# Patient Record
Sex: Male | Born: 1985 | Race: White | Hispanic: No | Marital: Married | Smoking: Current every day smoker
Health system: Southern US, Community
[De-identification: ages and names within clinical notes are randomized; demographics above are authoritative.]

## PROBLEM LIST (undated history)

## (undated) DIAGNOSIS — F329 Major depressive disorder, single episode, unspecified: Secondary | ICD-10-CM

## (undated) DIAGNOSIS — F32A Depression, unspecified: Secondary | ICD-10-CM

## (undated) HISTORY — PX: WISDOM TOOTH EXTRACTION: SHX21

## (undated) HISTORY — PX: TONSILLECTOMY: SUR1361

---

## 2005-06-12 ENCOUNTER — Emergency Department (HOSPITAL_COMMUNITY): Admission: EM | Admit: 2005-06-12 | Discharge: 2005-06-12 | Payer: Self-pay | Admitting: Emergency Medicine

## 2006-01-16 ENCOUNTER — Ambulatory Visit (HOSPITAL_COMMUNITY): Admission: RE | Admit: 2006-01-16 | Discharge: 2006-01-16 | Payer: Self-pay | Admitting: Family Medicine

## 2008-02-17 ENCOUNTER — Emergency Department (HOSPITAL_COMMUNITY): Admission: EM | Admit: 2008-02-17 | Discharge: 2008-02-17 | Payer: Self-pay | Admitting: Emergency Medicine

## 2008-02-24 ENCOUNTER — Emergency Department (HOSPITAL_COMMUNITY): Admission: EM | Admit: 2008-02-24 | Discharge: 2008-02-24 | Payer: Self-pay | Admitting: Emergency Medicine

## 2008-08-29 ENCOUNTER — Emergency Department (HOSPITAL_COMMUNITY): Admission: EM | Admit: 2008-08-29 | Discharge: 2008-08-29 | Payer: Self-pay | Admitting: Emergency Medicine

## 2008-09-20 ENCOUNTER — Emergency Department (HOSPITAL_COMMUNITY): Admission: EM | Admit: 2008-09-20 | Discharge: 2008-09-20 | Payer: Self-pay | Admitting: Emergency Medicine

## 2008-12-15 ENCOUNTER — Ambulatory Visit (HOSPITAL_COMMUNITY): Admission: RE | Admit: 2008-12-15 | Discharge: 2008-12-15 | Payer: Self-pay | Admitting: Chiropractic Medicine

## 2008-12-19 ENCOUNTER — Emergency Department (HOSPITAL_COMMUNITY): Admission: EM | Admit: 2008-12-19 | Discharge: 2008-12-19 | Payer: Self-pay | Admitting: Emergency Medicine

## 2009-02-17 ENCOUNTER — Emergency Department (HOSPITAL_COMMUNITY): Admission: EM | Admit: 2009-02-17 | Discharge: 2009-02-17 | Payer: Self-pay | Admitting: Emergency Medicine

## 2009-07-31 ENCOUNTER — Emergency Department (HOSPITAL_COMMUNITY): Admission: EM | Admit: 2009-07-31 | Discharge: 2009-07-31 | Payer: Self-pay | Admitting: Emergency Medicine

## 2010-05-13 LAB — URINALYSIS, ROUTINE W REFLEX MICROSCOPIC
Bilirubin Urine: NEGATIVE
Nitrite: NEGATIVE
pH: 6 (ref 5.0–8.0)

## 2010-05-13 LAB — BASIC METABOLIC PANEL
BUN: 16 mg/dL (ref 6–23)
CO2: 24 mEq/L (ref 19–32)
Chloride: 101 mEq/L (ref 96–112)
GFR calc non Af Amer: >60 mL/min (ref >60)
Glucose, Bld: 90 mg/dL (ref 70–99)
Sodium: 133 mEq/L — ABNORMAL LOW (ref 135–145)

## 2010-05-13 LAB — DIFFERENTIAL
Basophils Absolute: 0 10*3/uL (ref 0.0–0.1)
Basophils Relative: 0 % (ref 0–1)
Eosinophils Absolute: 0 10*3/uL (ref 0.0–0.7)
Eosinophils Relative: 0 % (ref 0–5)
Lymphocytes Relative: 8 % — ABNORMAL LOW (ref 12–46)
Lymphs Abs: 0.7 10*3/uL (ref 0.7–4.0)
Neutro Abs: 6.9 10*3/uL (ref 1.7–7.7)

## 2010-05-13 LAB — CBC
Hemoglobin: 15.6 g/dL (ref 13.0–17.0)
Platelets: 179 10*3/uL (ref 150–400)
RBC: 5.22 MIL/uL (ref 4.22–5.81)

## 2010-05-27 LAB — BASIC METABOLIC PANEL
BUN: 19 mg/dL (ref 6–23)
CO2: 22 mEq/L (ref 19–32)
Calcium: 9.4 mg/dL (ref 8.4–10.5)
Chloride: 104 mEq/L (ref 96–112)
Creatinine, Ser: 1.11 mg/dL (ref 0.4–1.5)
GFR calc Af Amer: >60 mL/min (ref >60)
GFR calc non Af Amer: >60 mL/min (ref >60)
Glucose, Bld: 119 mg/dL — ABNORMAL HIGH (ref 70–99)

## 2010-05-27 LAB — CBC
Hemoglobin: 16.5 g/dL (ref 13.0–17.0)
MCV: 87.6 fL (ref 78.0–100.0)
WBC: 12.2 10*3/uL — ABNORMAL HIGH (ref 4.0–10.5)

## 2010-05-27 LAB — DIFFERENTIAL
Basophils Absolute: 0 10*3/uL (ref 0.0–0.1)
Basophils Relative: 0 % (ref 0–1)
Eosinophils Absolute: 0.1 10*3/uL (ref 0.0–0.7)
Monocytes Absolute: 0.4 10*3/uL (ref 0.1–1.0)
Monocytes Relative: 3 % (ref 3–12)

## 2010-05-27 LAB — URINALYSIS, ROUTINE W REFLEX MICROSCOPIC
Hgb urine dipstick: NEGATIVE
Protein, ur: NEGATIVE mg/dL

## 2010-06-02 LAB — RAPID STREP SCREEN (MED CTR MEBANE ONLY): Streptococcus, Group A Screen (Direct): NEGATIVE

## 2010-06-02 LAB — DIFFERENTIAL
Basophils Absolute: 0 10*3/uL (ref 0.0–0.1)
Eosinophils Absolute: 0 10*3/uL (ref 0.0–0.7)
Eosinophils Relative: 0 % (ref 0–5)
Lymphocytes Relative: 9 % — ABNORMAL LOW (ref 12–46)
Lymphs Abs: 1 10*3/uL (ref 0.7–4.0)
Monocytes Absolute: 0.9 10*3/uL (ref 0.1–1.0)
Neutrophils Relative %: 83 % — ABNORMAL HIGH (ref 43–77)

## 2010-06-02 LAB — BASIC METABOLIC PANEL
Calcium: 9.5 mg/dL (ref 8.4–10.5)
Chloride: 103 mEq/L (ref 96–112)
Creatinine, Ser: 0.94 mg/dL (ref 0.4–1.5)
GFR calc Af Amer: >60 mL/min (ref >60)
Sodium: 137 mEq/L (ref 135–145)

## 2010-06-02 LAB — CBC
MCHC: 35.3 g/dL (ref 30.0–36.0)
MCV: 88.1 fL (ref 78.0–100.0)

## 2010-07-06 ENCOUNTER — Inpatient Hospital Stay (INDEPENDENT_AMBULATORY_CARE_PROVIDER_SITE_OTHER)
Admission: RE | Admit: 2010-07-06 | Discharge: 2010-07-06 | Disposition: A | Discharge status: Home / Self Care | Payer: BC Managed Care – PPO | Source: Ambulatory Visit | Attending: Emergency Medicine | Admitting: Emergency Medicine

## 2010-07-06 DIAGNOSIS — R599 Enlarged lymph nodes, unspecified: Secondary | ICD-10-CM

## 2010-07-06 LAB — POCT RAPID STREP A: Streptococcus, Group A Screen (Direct): NEGATIVE

## 2010-07-07 LAB — CBC
HCT: 43.6 % (ref 39.0–52.0)
Hemoglobin: 14.9 g/dL (ref 13.0–17.0)
MCH: 29.4 pg (ref 26.0–34.0)
MCV: 86 fL (ref 78.0–100.0)
Platelets: 200 10*3/uL (ref 150–400)
RDW: 12.6 % (ref 11.5–15.5)

## 2010-07-07 LAB — DIFFERENTIAL
Basophils Absolute: 0 10*3/uL (ref 0.0–0.1)
Basophils Relative: 0 % (ref 0–1)
Eosinophils Absolute: 0.1 10*3/uL (ref 0.0–0.7)
Lymphs Abs: 2.2 10*3/uL (ref 0.7–4.0)
Monocytes Relative: 10 % (ref 3–12)

## 2010-07-08 LAB — EPSTEIN-BARR VIRUS VCA ANTIBODY PANEL
EBV NA IgG: 0.52 {ISR}
EBV VCA IgG: 1.34 {ISR} — ABNORMAL HIGH
EBV VCA IgM: 0.07 {ISR}

## 2010-07-08 LAB — STREP A DNA PROBE: Group A Strep Probe: NEGATIVE

## 2010-08-09 ENCOUNTER — Other Ambulatory Visit: Payer: Self-pay | Admitting: Otolaryngology

## 2010-08-09 ENCOUNTER — Ambulatory Visit (HOSPITAL_BASED_OUTPATIENT_CLINIC_OR_DEPARTMENT_OTHER)
Admission: RE | Admit: 2010-08-09 | Discharge: 2010-08-09 | Disposition: A | Discharge status: Home / Self Care | Payer: BC Managed Care – PPO | Source: Ambulatory Visit | Attending: Otolaryngology | Admitting: Otolaryngology

## 2010-08-09 DIAGNOSIS — Z01812 Encounter for preprocedural laboratory examination: Secondary | ICD-10-CM | POA: Insufficient documentation

## 2010-08-09 DIAGNOSIS — J3501 Chronic tonsillitis: Secondary | ICD-10-CM | POA: Insufficient documentation

## 2010-08-09 DIAGNOSIS — K219 Gastro-esophageal reflux disease without esophagitis: Secondary | ICD-10-CM | POA: Insufficient documentation

## 2010-09-18 NOTE — Op Note (Signed)
  NAMEHOLT, WOOLBRIGHT          ACCOUNT NO.:  [id number]  MEDICAL RECORD NO.:  [id number]  LOCATION:                                 FACILITY:  PHYSICIAN:  Kristine Garbe. Ezzard Standing, M.D.DATE OF BIRTH:  1985-05-18  DATE OF PROCEDURE: DATE OF DISCHARGE:                              OPERATIVE REPORT   PREOPERATIVE DIAGNOSIS:  Recurrent tonsillitis with tonsillar hypertrophy.  POSTOPERATIVE DIAGNOSIS:  Recurrent tonsillitis with tonsillar hypertrophy.  OPERATION PERFORMED:  Tonsillectomy.  SURGEON:  Kristine Garbe. Ezzard Standing, MD  ANESTHESIA:  General endotracheal.  COMPLICATIONS:  None.  BRIEF CLINICAL NOTE:  Raymond Ramos is a 26 year old gentleman with history of recurrent tonsillitis.  He was taken to the operating room at this time for tonsillectomy.  DESCRIPTION OF PROCEDURE:  After adequate endotracheal anesthesia, the patient received 1 g Ancef IV preoperatively as well as 10 mg of Decadron.  A mouthgag was used to expose the oropharynx.  The left and right tonsils were resected from tonsillar fossa using the cautery. Care was taken to preserve the anterior-posterior tonsillar pillars. Hemostasis was obtained with cautery.  After obtaining adequate hemostasis, nasopharynx was examined and he had minimal adenoid tissue. Oropharynx was irrigated with saline.  This completed procedure.  The patient was awoke from anesthesia and transferred to the recovery room and postop doing well.  DISPOSITION:  [name] was discharged home later this morning.  He was given amoxicillin suspension 500 mg b.i.d. for 1 week, Tylenol or Lortab elixir 1-2 tablespoons 15-30 mL q.4 hours p.r.n. pain.  We will have him followup in my office in 10-14 days for recheck.          ______________________________ Kristine Garbe. Ezzard Standing, M.D.     CEN/MEDQ  D:  08/09/2010  T:  08/10/2010  Job:  [id number]  cc:   Holley Bouche, M.D.  Electronically Signed by Dillard Cannon M.D. on  09/18/2010 11:29:06 AM

## 2012-05-20 ENCOUNTER — Encounter (HOSPITAL_COMMUNITY): Payer: Self-pay | Admitting: Emergency Medicine

## 2012-05-20 ENCOUNTER — Emergency Department (HOSPITAL_COMMUNITY)
Admission: EM | Admit: 2012-05-20 | Discharge: 2012-05-20 | Disposition: A | Discharge status: Home / Self Care | Payer: PRIVATE HEALTH INSURANCE | Attending: Emergency Medicine | Admitting: Emergency Medicine

## 2012-05-20 DIAGNOSIS — J029 Acute pharyngitis, unspecified: Secondary | ICD-10-CM

## 2012-05-20 DIAGNOSIS — F17200 Nicotine dependence, unspecified, uncomplicated: Secondary | ICD-10-CM | POA: Insufficient documentation

## 2012-05-20 DIAGNOSIS — R51 Headache: Secondary | ICD-10-CM | POA: Insufficient documentation

## 2012-05-20 DIAGNOSIS — R599 Enlarged lymph nodes, unspecified: Secondary | ICD-10-CM | POA: Insufficient documentation

## 2012-05-20 MED ORDER — PENICILLIN G BENZATHINE 1200000 UNIT/2ML IM SUSP
1.2000 10*6.[IU] | Freq: Once | INTRAMUSCULAR | Status: AC
  Administered 2012-05-20: 1.2 10*6.[IU] via INTRAMUSCULAR
  Filled 2012-05-20: qty 2

## 2012-05-20 MED ORDER — DICLOFENAC SODIUM 75 MG PO TBEC: 75.0000 mg | DELAYED_RELEASE_TABLET | Freq: Two times a day (BID) | ORAL | Status: DC

## 2012-05-20 MED ORDER — KETOROLAC TROMETHAMINE 10 MG PO TABS
10.0000 mg | ORAL_TABLET | Freq: Once | ORAL | Status: AC
  Administered 2012-05-20: 10 mg via ORAL
  Filled 2012-05-20: qty 1

## 2012-05-20 NOTE — ED Notes (Signed)
Pt c/o ha/sore throat, neck swelling, and white spots in back of throat.

## 2012-05-20 NOTE — ED Provider Notes (Signed)
History     CSN: [id number]  Arrival date & time 05/20/12  4098   First MD Initiated Contact with Patient 05/20/12 [phone]      Chief Complaint  Patient presents with  . Sore Throat    (Consider location/radiation/quality/duration/timing/severity/associated sxs/prior treatment) Patient is a 27 y.o. male presenting with pharyngitis. The history is provided by the patient.  Sore Throat This is a new problem. The current episode started in the past 7 days. The problem occurs daily. The problem has been gradually worsening. Associated symptoms include headaches, a sore throat and swollen glands. Pertinent negatives include no abdominal pain, arthralgias, chest pain, coughing or neck pain. The symptoms are aggravated by swallowing. He has tried acetaminophen for the symptoms. The treatment provided no relief.    History reviewed. No pertinent past medical history.  History reviewed. No pertinent past surgical history.  History reviewed. No pertinent family history.  History  Substance Use Topics  . Smoking status: Current Every Day Smoker  . Smokeless tobacco: Not on file  . Alcohol Use: Yes     Comment: occasional      Review of Systems  Constitutional: Negative for activity change.       All ROS Neg except as noted in HPI  HENT: Positive for sore throat. Negative for nosebleeds and neck pain.   Eyes: Negative for photophobia and discharge.  Respiratory: Negative for cough, shortness of breath and wheezing.   Cardiovascular: Negative for chest pain and palpitations.  Gastrointestinal: Negative for abdominal pain and blood in stool.  Genitourinary: Negative for dysuria, frequency and hematuria.  Musculoskeletal: Negative for back pain and arthralgias.  Skin: Negative.   Neurological: Positive for headaches. Negative for dizziness, seizures and speech difficulty.  Psychiatric/Behavioral: Negative for hallucinations and confusion.    Allergies  Review of patient's allergies  indicates no known allergies.  Home Medications  No current outpatient prescriptions on file.  BP 145/96  Pulse 84  Temp(Src) 98 F (36.7 C) (Oral)  Resp 18  Ht 5\' 7"  (1.702 m)  Wt 160 lb (72.576 kg)  BMI 25.05 kg/m2  SpO2 100%  Physical Exam  Nursing note and vitals reviewed. Constitutional: He is oriented to person, place, and time. He appears well-developed and well-nourished.  Non-toxic appearance.  HENT:  Head: Normocephalic.  Right Ear: Tympanic membrane and external ear normal.  Left Ear: Tympanic membrane and external ear normal.  White patches in the posterior pharynx. Uvula midline. Airway clear. Speech is clear.  Eyes: EOM and lids are normal. Pupils are equal, round, and reactive to light.  Neck: Normal range of motion. Neck supple. Carotid bruit is not present.  Cardiovascular: Normal rate, regular rhythm, normal heart sounds, intact distal pulses and normal pulses.   No murmur heard. Pulmonary/Chest: Breath sounds normal. No respiratory distress.  Abdominal: Soft. Bowel sounds are normal. There is no tenderness. There is no guarding.  Musculoskeletal: Normal range of motion.  Lymphadenopathy:       Head (right side): No submandibular adenopathy present.       Head (left side): No submandibular adenopathy present.    He has no cervical adenopathy.  Neurological: He is alert and oriented to person, place, and time. He has normal strength. No cranial nerve deficit or sensory deficit.  Skin: Skin is warm and dry. No rash noted.  Psychiatric: He has a normal mood and affect. His speech is normal.    ED Course  Procedures (including critical care time)  Labs Reviewed  RAPID  STREP SCREEN   No results found.   No diagnosis found.    MDM  I have reviewed nursing notes, vital signs, and all appropriate lab and imaging results for this patient.  Pt's wife and children have all been treated for strep infection. Pt presents to the ED for c/o sore throat,  headache, body aching and swollen glands. Strep test negative. Pt ask to use salt water gargles and chloraseptic. Rx for diclofenac given for pain and aching. Pt encouraged to wash hands frequently.      Kathie Dike, PA-C 05/20/12 [phone]

## 2012-05-20 NOTE — ED Provider Notes (Signed)
Medical screening examination/treatment/procedure(s) were performed by non-physician practitioner and as supervising physician I was immediately available for consultation/collaboration.  Lenville Hibberd L Hashem Goynes, MD 05/20/12 1614 

## 2012-06-02 ENCOUNTER — Encounter (HOSPITAL_COMMUNITY): Payer: Self-pay

## 2012-06-02 ENCOUNTER — Emergency Department (HOSPITAL_COMMUNITY)
Admission: EM | Admit: 2012-06-02 | Discharge: 2012-06-02 | Disposition: A | Discharge status: Home / Self Care | Payer: PRIVATE HEALTH INSURANCE | Attending: Emergency Medicine | Admitting: Emergency Medicine

## 2012-06-02 DIAGNOSIS — F17200 Nicotine dependence, unspecified, uncomplicated: Secondary | ICD-10-CM | POA: Insufficient documentation

## 2012-06-02 DIAGNOSIS — L439 Lichen planus, unspecified: Secondary | ICD-10-CM | POA: Insufficient documentation

## 2012-06-02 DIAGNOSIS — Z79899 Other long term (current) drug therapy: Secondary | ICD-10-CM | POA: Insufficient documentation

## 2012-06-02 DIAGNOSIS — Z8619 Personal history of other infectious and parasitic diseases: Secondary | ICD-10-CM | POA: Insufficient documentation

## 2012-06-02 DIAGNOSIS — L438 Other lichen planus: Secondary | ICD-10-CM

## 2012-06-02 MED ORDER — DIPHENHYD-HYDROCORT-NYSTATIN MT SUSP: OROMUCOSAL | Status: DC

## 2012-06-02 NOTE — ED Notes (Signed)
complain of spots in in throat that move around. Here two weeks ago and was tested neg. For strep

## 2012-06-02 NOTE — ED Provider Notes (Signed)
History     CSN: [id number]  Arrival date & time 06/02/12  4098   First MD Initiated Contact with Patient 06/02/12 [phone]      Chief Complaint  Patient presents with  . Sore Throat    (Consider location/radiation/quality/duration/timing/severity/associated sxs/prior treatment) HPI Comments: Patient c/o reoccurring white lesions to the oral mucosa.  States the areas are not painful.  States the sx's have been present for two weeks and noticed them appear after he was diagnosed with a viral throat infection.  He states the lesions appear then improve, but leave a red area.  He denies sore thorat, fever, difficulty swallowing or chewing.  He denies using smokeless tobacco  Patient is a 27 y.o. male presenting with mouth sores. The history is provided by the patient.  Mouth Lesions  The current episode started more than 2 weeks ago. The onset was gradual. The problem occurs frequently. The problem has been unchanged. The problem is mild. Nothing relieves the symptoms. Nothing aggravates the symptoms. Associated symptoms include mouth sores. Pertinent negatives include no orthopnea, no fever, no double vision, no abdominal pain, no nausea, no vomiting, no congestion, no ear pain, no headaches, no rhinorrhea, no sore throat, no stridor, no swollen glands, no muscle aches, no neck pain, no cough, no URI, no rash, no eye discharge and no eye pain. He has been behaving normally. He has been eating and drinking normally. There were sick contacts at home. Recently, medical care has been given at this facility. Services received include medications given.    History reviewed. No pertinent past medical history.  History reviewed. No pertinent past surgical history.  No family history on file.  History  Substance Use Topics  . Smoking status: Current Every Day Smoker  . Smokeless tobacco: Not on file  . Alcohol Use: Yes     Comment: occasional      Review of Systems  Constitutional: Negative  for fever, chills, activity change and appetite change.  HENT: Positive for mouth sores. Negative for ear pain, congestion, sore throat, facial swelling, rhinorrhea, trouble swallowing, neck pain, neck stiffness and voice change.   Eyes: Negative for double vision, pain, discharge and visual disturbance.  Respiratory: Negative for cough, shortness of breath and stridor.   Cardiovascular: Negative for orthopnea.  Gastrointestinal: Negative for nausea, vomiting and abdominal pain.  Musculoskeletal: Negative for arthralgias.  Skin: Negative for color change and rash.  Neurological: Negative for dizziness, facial asymmetry, speech difficulty, numbness and headaches.  Hematological: Negative for adenopathy.  All other systems reviewed and are negative.    Allergies  Review of patient's allergies indicates no known allergies.  Home Medications   Current Outpatient Rx  Name  Route  Sig  Dispense  Refill  . citalopram (CELEXA) 20 MG tablet   Oral   Take 20 mg by mouth daily.         . diclofenac (VOLTAREN) 75 MG EC tablet   Oral   Take 1 tablet (75 mg total) by mouth 2 (two) times daily.   12 tablet   0     BP 137/78  Pulse 73  Temp(Src) 98.1 F (36.7 C) (Oral)  Resp 16  Ht 5\' 7"  (1.702 m)  Wt 165 lb (74.844 kg)  BMI 25.84 kg/m2  SpO2 99%  Physical Exam  Nursing note and vitals reviewed. Constitutional: He is oriented to person, place, and time. He appears well-developed and well-nourished. No distress.  HENT:  Head: Normocephalic and atraumatic. No trismus in  the jaw.  Right Ear: Tympanic membrane and ear canal normal.  Left Ear: Tympanic membrane and ear canal normal.  Mouth/Throat: Uvula is midline and mucous membranes are normal. Oral lesions present. No edematous. No oropharyngeal exudate, posterior oropharyngeal edema, posterior oropharyngeal erythema or tonsillar abscesses.  Three pin point white lesions to the oral mucosa and over the hard palate.  Lesions do not  wipe off.  No lesions of the tongue.  Airway patent  Neck: Normal range of motion. Neck supple.  Cardiovascular: Normal rate, regular rhythm and normal heart sounds.   No murmur heard. Pulmonary/Chest: Effort normal and breath sounds normal.  Musculoskeletal: Normal range of motion.  Lymphadenopathy:    He has no cervical adenopathy.  Neurological: He is alert and oriented to person, place, and time. He exhibits normal muscle tone. Coordination normal.  Skin: Skin is warm and dry.    ED Course  Procedures (including critical care time)  Labs Reviewed - No data to display No results found.      MDM     Previous ED chart reviewed, had neg strep.  No cervical lymphadenopathy.  Pt is well appearing. Denies pain to his mouth or difficulty swallowing.  I have discussed possible lichen planus vs viral syndrome with the patient.  He agrees to magic mouthwash and close f/u with his PMD   The patient appears reasonably screened and/or stabilized for discharge and I doubt any other medical condition or other [facility] requiring further screening, evaluation, or treatment in the ED at this time prior to discharge.      Keelin Neville L. Trisha Mangle, PA-C 06/03/12 2157

## 2012-06-05 NOTE — ED Provider Notes (Signed)
Medical screening examination/treatment/procedure(s) were performed by non-physician practitioner and as supervising physician I was immediately available for consultation/collaboration.  Zuriel Yeaman, MD 06/05/12 1133 

## 2012-10-31 ENCOUNTER — Encounter (HOSPITAL_COMMUNITY): Payer: Self-pay

## 2012-10-31 ENCOUNTER — Emergency Department (HOSPITAL_COMMUNITY)
Admission: EM | Admit: 2012-10-31 | Discharge: 2012-10-31 | Disposition: A | Discharge status: Home / Self Care | Payer: PRIVATE HEALTH INSURANCE | Attending: Emergency Medicine | Admitting: Emergency Medicine

## 2012-10-31 ENCOUNTER — Emergency Department (HOSPITAL_COMMUNITY): Payer: PRIVATE HEALTH INSURANCE

## 2012-10-31 DIAGNOSIS — M25579 Pain in unspecified ankle and joints of unspecified foot: Secondary | ICD-10-CM | POA: Insufficient documentation

## 2012-10-31 DIAGNOSIS — R51 Headache: Secondary | ICD-10-CM | POA: Insufficient documentation

## 2012-10-31 DIAGNOSIS — M25572 Pain in left ankle and joints of left foot: Secondary | ICD-10-CM

## 2012-10-31 DIAGNOSIS — R5381 Other malaise: Secondary | ICD-10-CM | POA: Insufficient documentation

## 2012-10-31 DIAGNOSIS — Z79899 Other long term (current) drug therapy: Secondary | ICD-10-CM | POA: Insufficient documentation

## 2012-10-31 DIAGNOSIS — F17200 Nicotine dependence, unspecified, uncomplicated: Secondary | ICD-10-CM | POA: Insufficient documentation

## 2012-10-31 LAB — COMPREHENSIVE METABOLIC PANEL
ALT: 20 U/L (ref 0–53)
AST: 21 U/L (ref 0–37)
Albumin: 4.3 g/dL (ref 3.5–5.2)
Calcium: 9.9 mg/dL (ref 8.4–10.5)
Creatinine, Ser: 1.04 mg/dL (ref 0.50–1.35)
GFR calc non Af Amer: >90 mL/min (ref >90)
Sodium: 140 mEq/L (ref 135–145)
Total Protein: 7.5 g/dL (ref 6.0–8.3)

## 2012-10-31 LAB — CBC WITH DIFFERENTIAL/PLATELET
Basophils Absolute: 0 10*3/uL (ref 0.0–0.1)
Basophils Relative: 0 % (ref 0–1)
Eosinophils Absolute: 0.3 10*3/uL (ref 0.0–0.7)
Eosinophils Relative: 3 % (ref 0–5)
HCT: 42.7 % (ref 39.0–52.0)
MCHC: 34.9 g/dL (ref 30.0–36.0)
MCV: 87.5 fL (ref 78.0–100.0)
Monocytes Absolute: 0.5 10*3/uL (ref 0.1–1.0)
Platelets: 210 10*3/uL (ref 150–400)
RDW: 12.3 % (ref 11.5–15.5)
WBC: 10.2 10*3/uL (ref 4.0–10.5)

## 2012-10-31 MED ORDER — SODIUM CHLORIDE 0.9 % IV BOLUS (SEPSIS)
1000.0000 mL | Freq: Once | INTRAVENOUS | Status: AC
  Administered 2012-10-31: 1000 mL via INTRAVENOUS

## 2012-10-31 MED ORDER — METOCLOPRAMIDE HCL 5 MG/ML IJ SOLN
10.0000 mg | Freq: Once | INTRAMUSCULAR | Status: AC
  Administered 2012-10-31: 10 mg via INTRAVENOUS
  Filled 2012-10-31: qty 2

## 2012-10-31 MED ORDER — KETOROLAC TROMETHAMINE 30 MG/ML IJ SOLN
30.0000 mg | Freq: Once | INTRAMUSCULAR | Status: AC
  Administered 2012-10-31: 30 mg via INTRAVENOUS
  Filled 2012-10-31: qty 1

## 2012-10-31 MED ORDER — DIPHENHYDRAMINE HCL 50 MG/ML IJ SOLN
25.0000 mg | Freq: Once | INTRAMUSCULAR | Status: AC
  Administered 2012-10-31: 25 mg via INTRAVENOUS
  Filled 2012-10-31: qty 1

## 2012-10-31 NOTE — ED Provider Notes (Signed)
CSN: [id number]     Arrival date & time 10/31/12  1838 History   First MD Initiated Contact with Patient 10/31/12 1857     Chief Complaint  Patient presents with  . Headache  . Ankle Pain   (Consider location/radiation/quality/duration/timing/severity/associated sxs/prior Treatment) HPI Comments: Patient is a 27 year old otherwise healthy male presents to the emergency department with complaints of frontal headache for the past 2 hours. States that he was outside with his family and believes he may have overheated. He feels extremely weak states that when he stands he feels as though he is going to pass out. He denies any fevers or stiff neck. He denies any visual complaints. He tried acetaminophen but this did not work.  He also complains of pain in the left ankle for the past 2 months in the absence of any injury or trauma. He states it is worse when he walks and worse when he pushes on it.  Patient is a 27 y.o. male presenting with headaches. The history is provided by the patient.  Headache Pain location:  Frontal Quality:  Stabbing Radiates to:  Does not radiate Onset quality:  Sudden Duration:  2 hours Timing:  Constant Progression:  Worsening Chronicity:  New Context: activity and bright light   Relieved by:  Nothing Worsened by:  Nothing tried Ineffective treatments:  Acetaminophen Associated symptoms: no abdominal pain     History reviewed. No pertinent past medical history. Past Surgical History  Procedure Laterality Date  . Tonsillectomy    . Wisdom tooth extraction     No family history on file. History  Substance Use Topics  . Smoking status: Current Some Day Smoker    Types: Cigarettes  . Smokeless tobacco: Not on file  . Alcohol Use: Yes     Comment: occasional    Review of Systems  Gastrointestinal: Negative for abdominal pain.  Neurological: Positive for headaches.  All other systems reviewed and are negative.    Allergies  Review of patient's  allergies indicates no known allergies.  Home Medications   Current Outpatient Rx  Name  Route  Sig  Dispense  Refill  . citalopram (CELEXA) 20 MG tablet   Oral   Take 20 mg by mouth daily.         . Diphenhyd-Hydrocort-Nystatin SUSP      5 ml po swish and spit TID   150 mL   0   . HYDROcodone-acetaminophen (NORCO) 7.5-325 MG per tablet   Oral   Take 1 tablet by mouth every 6 (six) hours as needed for pain.         . Multiple Vitamin (MULTIVITAMIN WITH MINERALS) TABS   Oral   Take 1 tablet by mouth daily.          BP 133/95  Pulse 63  Temp(Src) 98.5 F (36.9 C) (Oral)  Resp 18  Ht 5\' 7"  (1.702 m)  Wt 160 lb (72.576 kg)  BMI 25.05 kg/m2  SpO2 100% Physical Exam  Nursing note and vitals reviewed. Constitutional: He is oriented to person, place, and time. He appears well-developed and well-nourished. No distress.  HENT:  Head: Normocephalic and atraumatic.  Mouth/Throat: Oropharynx is clear and moist.  Eyes: EOM are normal. Pupils are equal, round, and reactive to light.  No papilledema  Neck: Normal range of motion. Neck supple.  Cardiovascular: Normal rate, regular rhythm and normal heart sounds.   No murmur heard. Pulmonary/Chest: Effort normal and breath sounds normal. No respiratory distress. He has no  wheezes.  Abdominal: Soft. Bowel sounds are normal. He exhibits no distension. There is no tenderness.  Musculoskeletal: Normal range of motion. He exhibits no edema.  The left ankle is tender to palpation over the lateral malleolus. There is no gross abnormality and range of motion appears adequate.  Lymphadenopathy:    He has no cervical adenopathy.  Neurological: He is alert and oriented to person, place, and time. No cranial nerve deficit. He exhibits normal muscle tone. Coordination normal.  Skin: Skin is warm and dry. He is not diaphoretic.    ED Course  Procedures (including critical care time) Labs Review Labs Reviewed  CBC WITH DIFFERENTIAL   COMPREHENSIVE METABOLIC PANEL   Imaging Review No results found.  MDM  No diagnosis found. This patient is a 27 year old otherwise healthy male who presents with complaints of headache for the past 2 hours. His neurologic exam is nonfocal and CAT scan is unremarkable. He often complained of weakness and laboratory studies were performed which were all normal. He also complained of ankle pain for which I performed an x-ray which was negative. He feels better with the medications given in the ED and I believe is stable for discharge to home. I see no indications for LP at this time the    Geoffery Lyons, MD 10/31/12 2053

## 2012-10-31 NOTE — ED Notes (Signed)
Pt reports a headache for 2 hours pta, it is making him weak and tired. Also wants his left foot checked out, it has been bothering him for 2 months, denies any known injury.

## 2013-01-24 ENCOUNTER — Emergency Department (HOSPITAL_COMMUNITY): Payer: PRIVATE HEALTH INSURANCE

## 2013-01-24 ENCOUNTER — Encounter (HOSPITAL_COMMUNITY): Payer: Self-pay | Admitting: Emergency Medicine

## 2013-01-24 ENCOUNTER — Emergency Department (HOSPITAL_COMMUNITY)
Admission: EM | Admit: 2013-01-24 | Discharge: 2013-01-24 | Disposition: A | Discharge status: Home / Self Care | Payer: PRIVATE HEALTH INSURANCE | Attending: Emergency Medicine | Admitting: Emergency Medicine

## 2013-01-24 DIAGNOSIS — Z79899 Other long term (current) drug therapy: Secondary | ICD-10-CM | POA: Insufficient documentation

## 2013-01-24 DIAGNOSIS — S60221A Contusion of right hand, initial encounter: Secondary | ICD-10-CM

## 2013-01-24 DIAGNOSIS — IMO0002 Reserved for concepts with insufficient information to code with codable children: Secondary | ICD-10-CM | POA: Insufficient documentation

## 2013-01-24 DIAGNOSIS — S60229A Contusion of unspecified hand, initial encounter: Secondary | ICD-10-CM | POA: Insufficient documentation

## 2013-01-24 DIAGNOSIS — S60511A Abrasion of right hand, initial encounter: Secondary | ICD-10-CM

## 2013-01-24 DIAGNOSIS — Z23 Encounter for immunization: Secondary | ICD-10-CM | POA: Insufficient documentation

## 2013-01-24 DIAGNOSIS — Z87891 Personal history of nicotine dependence: Secondary | ICD-10-CM | POA: Insufficient documentation

## 2013-01-24 MED ORDER — AMOXICILLIN-POT CLAVULANATE 875-125 MG PO TABS
1.0000 | ORAL_TABLET | Freq: Once | ORAL | Status: AC
  Administered 2013-01-24: 1 via ORAL
  Filled 2013-01-24: qty 1

## 2013-01-24 MED ORDER — AMOXICILLIN-POT CLAVULANATE 875-125 MG PO TABS: 1.0000 | ORAL_TABLET | Freq: Two times a day (BID) | ORAL | Status: DC

## 2013-01-24 MED ORDER — IBUPROFEN 800 MG PO TABS
800.0000 mg | ORAL_TABLET | Freq: Once | ORAL | Status: AC
  Administered 2013-01-24: 800 mg via ORAL
  Filled 2013-01-24: qty 1

## 2013-01-24 MED ORDER — BACITRACIN ZINC 500 UNIT/GM EX OINT
TOPICAL_OINTMENT | CUTANEOUS | Status: AC
  Administered 2013-01-24: 1
  Filled 2013-01-24: qty 0.9

## 2013-01-24 MED ORDER — IBUPROFEN 800 MG PO TABS: 800.0000 mg | ORAL_TABLET | Freq: Three times a day (TID) | ORAL | Status: DC

## 2013-01-24 MED ORDER — TETANUS-DIPHTH-ACELL PERTUSSIS 5-2.5-18.5 LF-MCG/0.5 IM SUSP
0.5000 mL | Freq: Once | INTRAMUSCULAR | Status: AC
  Administered 2013-01-24: 0.5 mL via INTRAMUSCULAR
  Filled 2013-01-24: qty 0.5

## 2013-01-24 MED ORDER — ONDANSETRON HCL 4 MG PO TABS
4.0000 mg | ORAL_TABLET | Freq: Once | ORAL | Status: AC
  Administered 2013-01-24: 4 mg via ORAL
  Filled 2013-01-24: qty 1

## 2013-01-24 NOTE — ED Notes (Signed)
Abrasions to hand cleansed, bacitracin, telfa, kling.

## 2013-01-24 NOTE — ED Notes (Signed)
Pain , swelling to rt hand esp at 5th finger.  Ice pack applied.
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
## 2013-01-25 NOTE — ED Provider Notes (Signed)
Medical screening examination/treatment/procedure(s) were performed by non-physician practitioner and as supervising physician I was immediately available for consultation/collaboration.  EKG Interpretation   None         Glynn Octave, MD 01/25/13 0021

## 2013-02-04 ENCOUNTER — Emergency Department (HOSPITAL_COMMUNITY)
Admission: EM | Admit: 2013-02-04 | Discharge: 2013-02-04 | Disposition: A | Discharge status: Home / Self Care | Payer: PRIVATE HEALTH INSURANCE | Attending: Emergency Medicine | Admitting: Emergency Medicine

## 2013-02-04 ENCOUNTER — Encounter (HOSPITAL_COMMUNITY): Payer: Self-pay | Admitting: Emergency Medicine

## 2013-02-04 DIAGNOSIS — Z79899 Other long term (current) drug therapy: Secondary | ICD-10-CM | POA: Insufficient documentation

## 2013-02-04 DIAGNOSIS — Z87891 Personal history of nicotine dependence: Secondary | ICD-10-CM | POA: Insufficient documentation

## 2013-02-04 DIAGNOSIS — Z792 Long term (current) use of antibiotics: Secondary | ICD-10-CM | POA: Insufficient documentation

## 2013-02-04 DIAGNOSIS — R5381 Other malaise: Secondary | ICD-10-CM | POA: Insufficient documentation

## 2013-02-04 DIAGNOSIS — R52 Pain, unspecified: Secondary | ICD-10-CM | POA: Insufficient documentation

## 2013-02-04 DIAGNOSIS — R509 Fever, unspecified: Secondary | ICD-10-CM | POA: Insufficient documentation

## 2013-02-04 DIAGNOSIS — F3289 Other specified depressive episodes: Secondary | ICD-10-CM | POA: Insufficient documentation

## 2013-02-04 DIAGNOSIS — R6889 Other general symptoms and signs: Secondary | ICD-10-CM

## 2013-02-04 DIAGNOSIS — F329 Major depressive disorder, single episode, unspecified: Secondary | ICD-10-CM | POA: Insufficient documentation

## 2013-02-04 DIAGNOSIS — J029 Acute pharyngitis, unspecified: Secondary | ICD-10-CM | POA: Insufficient documentation

## 2013-02-04 DIAGNOSIS — J3489 Other specified disorders of nose and nasal sinuses: Secondary | ICD-10-CM | POA: Insufficient documentation

## 2013-02-04 HISTORY — DX: Major depressive disorder, single episode, unspecified: F32.9

## 2013-02-04 HISTORY — DX: Depression, unspecified: F32.A

## 2013-02-04 MED ORDER — OSELTAMIVIR PHOSPHATE 75 MG PO CAPS: 75.0000 mg | ORAL_CAPSULE | Freq: Two times a day (BID) | ORAL | Status: DC

## 2013-02-04 NOTE — ED Notes (Signed)
Pt states he hasn't been running a fever but has had chills, neck pain, shoulder pain, and generalized body aches for past two days, worse today. Pt also complains of sore throat and sinus drainage, clear.

## 2013-02-04 NOTE — ED Notes (Signed)
Pt alert & oriented x4, stable gait. Patient given discharge instructions, paperwork & prescription(s). Patient  instructed to stop at the registration desk to finish any additional paperwork. Patient verbalized understanding. Pt left department w/ no further questions. 

## 2013-02-04 NOTE — ED Notes (Signed)
Pt has had chills and body aches for two days, night sweats

## 2013-02-04 NOTE — ED Provider Notes (Signed)
CSN: [id number]     Arrival date & time 02/04/13  0911 History   First MD Initiated Contact with Patient 02/04/13 [phone]     Chief Complaint  Patient presents with  . Chills  . Generalized Body Aches  . Nasal Congestion  . Sore Throat   (Consider location/radiation/quality/duration/timing/severity/associated sxs/prior Treatment) HPI Comments: Raymond Ramos is a 27 y.o. Male presenting with a 2 day history of  Generalized body aches,  Particularly across his shoulders, neck and lower back,  Chills and sweats,clear rhinorrhea and nasal congestion which is thicker drainage just early am when he first wakes, mild sore throat and generalized malaise.  He has been able to tolerate po fluids and solid intake, describing a brief episode of nausea yesterday after eating lunch but none since.   Symptoms due to not include shortness of breath, chest pain, rash or vomiting or diarrhea. He has been exposed to coworkers with similar symptoms.  He has not obtained his flu vaccine this season. The patient has taken ibuprofen, last dose yesterday evening with transient  improvement in symptoms.     The history is provided by the patient.    Past Medical History  Diagnosis Date  . Depression    Past Surgical History  Procedure Laterality Date  . Tonsillectomy    . Wisdom tooth extraction     History reviewed. No pertinent family history. History  Substance Use Topics  . Smoking status: Former Smoker    Types: Cigarettes  . Smokeless tobacco: Current User    Types: Chew  . Alcohol Use: Yes     Comment: occasional    Review of Systems  Constitutional: Positive for fever, chills and fatigue.  HENT: Positive for congestion, rhinorrhea and sore throat. Negative for ear pain, sinus pressure, trouble swallowing and voice change.   Eyes: Negative for discharge.  Respiratory: Negative for cough, shortness of breath, wheezing and stridor.   Cardiovascular: Negative for chest pain.   Gastrointestinal: Negative for vomiting, abdominal pain and diarrhea.  Genitourinary: Negative.   Musculoskeletal: Positive for myalgias.  Skin: Negative for rash.    Allergies  Review of patient's allergies indicates no known allergies.  Home Medications   Current Outpatient Rx  Name  Route  Sig  Dispense  Refill  . amoxicillin-clavulanate (AUGMENTIN) 875-125 MG per tablet   Oral   Take 1 tablet by mouth 2 (two) times daily.   12 tablet   0     Please take with food   . citalopram (CELEXA) 20 MG tablet   Oral   Take 20 mg by mouth at bedtime.          Marland Kitchen ibuprofen (ADVIL,MOTRIN) 800 MG tablet   Oral   Take 1 tablet (800 mg total) by mouth 3 (three) times daily.   21 tablet   0     Please take with food   . Multiple Vitamin (MULTIVITAMIN WITH MINERALS) TABS   Oral   Take 1 tablet by mouth daily.         Marland Kitchen oseltamivir (TAMIFLU) 75 MG capsule   Oral   Take 1 capsule (75 mg total) by mouth every 12 (twelve) hours.   10 capsule   0    BP 132/78  Pulse 78  Temp(Src) 98.6 F (37 C) (Oral)  Resp 16  Ht 5\' 7"  (1.702 m)  Wt 165 lb (74.844 kg)  BMI 25.84 kg/m2  SpO2 100% Physical Exam  Constitutional: He is oriented to person, place,  and time. He appears well-developed and well-nourished.  HENT:  Head: Normocephalic and atraumatic.  Right Ear: Tympanic membrane and ear canal normal.  Left Ear: Tympanic membrane and ear canal normal.  Nose: Rhinorrhea present. No mucosal edema.  Mouth/Throat: Uvula is midline, oropharynx is clear and moist and mucous membranes are normal. No oropharyngeal exudate, posterior oropharyngeal edema, posterior oropharyngeal erythema or tonsillar abscesses.  Eyes: Conjunctivae are normal.  Cardiovascular: Normal rate and normal heart sounds.   Pulmonary/Chest: Effort normal. No respiratory distress. He has no wheezes. He has no rales.  Abdominal: Soft. There is no tenderness.  Musculoskeletal: Normal range of motion.   Neurological: He is alert and oriented to person, place, and time.  Skin: Skin is warm and dry. No rash noted.  Psychiatric: He has a normal mood and affect.    ED Course  Procedures (including critical care time) Labs Review Labs Reviewed - No data to display Imaging Review No results found.  EKG Interpretation   None       MDM   1. Flu-like symptoms    Pt describes exposure to coworkers with influenza like illness.  Discussed pro's and con's of tamiflu. Patient opted to take this medicine which was prescribed .  Encouraged rest, fluid intake, continue ibuprofen for throat pain, myalgias,  Plan f/u for any worsened sx , sob, spiking fevers, increased fatigue.  Prn f/u expected.    Burgess Amor, PA-C 02/04/13 1003

## 2013-02-07 NOTE — ED Provider Notes (Signed)
Medical screening examination/treatment/procedure(s) were performed by non-physician practitioner and as supervising physician I was immediately available for consultation/collaboration.  EKG Interpretation   None        Kilea Mccarey R. Tareka Jhaveri, MD 02/07/13 2111 

## 2013-04-27 ENCOUNTER — Emergency Department (HOSPITAL_COMMUNITY)
Admission: EM | Admit: 2013-04-27 | Discharge: 2013-04-27 | Disposition: A | Discharge status: Home / Self Care | Payer: 59 | Attending: Emergency Medicine | Admitting: Emergency Medicine

## 2013-04-27 ENCOUNTER — Emergency Department (HOSPITAL_COMMUNITY): Payer: 59

## 2013-04-27 ENCOUNTER — Encounter (HOSPITAL_COMMUNITY): Payer: Self-pay | Admitting: Emergency Medicine

## 2013-04-27 DIAGNOSIS — Z792 Long term (current) use of antibiotics: Secondary | ICD-10-CM | POA: Insufficient documentation

## 2013-04-27 DIAGNOSIS — F3289 Other specified depressive episodes: Secondary | ICD-10-CM | POA: Insufficient documentation

## 2013-04-27 DIAGNOSIS — K529 Noninfective gastroenteritis and colitis, unspecified: Secondary | ICD-10-CM

## 2013-04-27 DIAGNOSIS — K5289 Other specified noninfective gastroenteritis and colitis: Secondary | ICD-10-CM | POA: Insufficient documentation

## 2013-04-27 DIAGNOSIS — Z791 Long term (current) use of non-steroidal anti-inflammatories (NSAID): Secondary | ICD-10-CM | POA: Insufficient documentation

## 2013-04-27 DIAGNOSIS — E860 Dehydration: Secondary | ICD-10-CM

## 2013-04-27 DIAGNOSIS — Z79899 Other long term (current) drug therapy: Secondary | ICD-10-CM | POA: Insufficient documentation

## 2013-04-27 DIAGNOSIS — Z87891 Personal history of nicotine dependence: Secondary | ICD-10-CM | POA: Insufficient documentation

## 2013-04-27 DIAGNOSIS — F329 Major depressive disorder, single episode, unspecified: Secondary | ICD-10-CM | POA: Insufficient documentation

## 2013-04-27 LAB — COMPREHENSIVE METABOLIC PANEL
ALBUMIN: 5.4 g/dL — AB (ref 3.5–5.2)
ALT: 16 U/L (ref 0–53)
AST: 22 U/L (ref 0–37)
Alkaline Phosphatase: 61 U/L (ref 39–117)
BUN: 22 mg/dL (ref 6–23)
CALCIUM: 10.4 mg/dL (ref 8.4–10.5)
CHLORIDE: 101 meq/L (ref 96–112)
CO2: 23 meq/L (ref 19–32)
Creatinine, Ser: 1.5 mg/dL — ABNORMAL HIGH (ref 0.50–1.35)
GFR calc Af Amer: 71 mL/min — ABNORMAL LOW (ref >90)
GFR, EST NON AFRICAN AMERICAN: 61 mL/min — AB (ref >90)
Glucose, Bld: 144 mg/dL — ABNORMAL HIGH (ref 70–99)
Potassium: 5 mEq/L (ref 3.7–5.3)
SODIUM: 138 meq/L (ref 137–147)
Total Bilirubin: 0.7 mg/dL (ref 0.3–1.2)
Total Protein: 9.7 g/dL — ABNORMAL HIGH (ref 6.0–8.3)

## 2013-04-27 LAB — URINALYSIS, ROUTINE W REFLEX MICROSCOPIC
GLUCOSE, UA: NEGATIVE mg/dL
HGB URINE DIPSTICK: NEGATIVE
Ketones, ur: NEGATIVE mg/dL
Leukocytes, UA: NEGATIVE
Nitrite: NEGATIVE
PROTEIN: NEGATIVE mg/dL
Specific Gravity, Urine: >1.03 — ABNORMAL HIGH (ref 1.005–1.030)
Urobilinogen, UA: 0.2 mg/dL (ref 0.0–1.0)
pH: 5 (ref 5.0–8.0)

## 2013-04-27 LAB — CBC WITH DIFFERENTIAL/PLATELET
BASOS ABS: 0 10*3/uL (ref 0.0–0.1)
BASOS PCT: 0 % (ref 0–1)
EOS PCT: 0 % (ref 0–5)
Eosinophils Absolute: 0.1 10*3/uL (ref 0.0–0.7)
HCT: 49.5 % (ref 39.0–52.0)
Hemoglobin: 17.1 g/dL — ABNORMAL HIGH (ref 13.0–17.0)
LYMPHS PCT: 3 % — AB (ref 12–46)
Lymphs Abs: 0.6 10*3/uL — ABNORMAL LOW (ref 0.7–4.0)
MCH: 30.4 pg (ref 26.0–34.0)
MCHC: 34.5 g/dL (ref 30.0–36.0)
MCV: 88.1 fL (ref 78.0–100.0)
Monocytes Absolute: 0.9 10*3/uL (ref 0.1–1.0)
Monocytes Relative: 5 % (ref 3–12)
NEUTROS ABS: 18.3 10*3/uL — AB (ref 1.7–7.7)
Neutrophils Relative %: 92 % — ABNORMAL HIGH (ref 43–77)
PLATELETS: 226 10*3/uL (ref 150–400)
RBC: 5.62 MIL/uL (ref 4.22–5.81)
RDW: 12.8 % (ref 11.5–15.5)
WBC: 19.9 10*3/uL — AB (ref 4.0–10.5)

## 2013-04-27 LAB — LIPASE, BLOOD: Lipase: 15 U/L (ref 11–59)

## 2013-04-27 MED ORDER — SODIUM CHLORIDE 0.9 % IV BOLUS (SEPSIS)
1000.0000 mL | Freq: Once | INTRAVENOUS | Status: AC
  Administered 2013-04-27: 1000 mL via INTRAVENOUS

## 2013-04-27 MED ORDER — HYDROCODONE-ACETAMINOPHEN 5-325 MG PO TABS: 2.0000 | ORAL_TABLET | Freq: Four times a day (QID) | ORAL | Status: DC | PRN

## 2013-04-27 MED ORDER — ONDANSETRON HCL 4 MG PO TABS
4.0000 mg | ORAL_TABLET | Freq: Three times a day (TID) | ORAL | Status: AC | PRN
Start: 2013-04-27 — End: ?

## 2013-04-27 MED ORDER — FENTANYL CITRATE 0.05 MG/ML IJ SOLN
50.0000 ug | Freq: Once | INTRAMUSCULAR | Status: AC
  Administered 2013-04-27: 50 ug via INTRAVENOUS
  Filled 2013-04-27: qty 2

## 2013-04-27 MED ORDER — ONDANSETRON HCL 4 MG/2ML IJ SOLN
4.0000 mg | Freq: Once | INTRAMUSCULAR | Status: AC
  Administered 2013-04-27: 4 mg via INTRAVENOUS
  Filled 2013-04-27: qty 2

## 2013-04-27 MED ORDER — PANTOPRAZOLE SODIUM 40 MG IV SOLR
40.0000 mg | Freq: Once | INTRAVENOUS | Status: AC
  Administered 2013-04-27: 40 mg via INTRAVENOUS
  Filled 2013-04-27: qty 40

## 2013-04-27 NOTE — ED Provider Notes (Signed)
CSN: [id number]     Arrival date & time 04/27/13  0145 History   First MD Initiated Contact with Patient 04/27/13 0147     Chief Complaint  Patient presents with  . Emesis  . Diarrhea     (Consider location/radiation/quality/duration/timing/severity/associated sxs/prior Treatment) Patient is a 28 y.o. male presenting with vomiting and diarrhea.  Emesis Associated symptoms: diarrhea   Diarrhea Associated symptoms: vomiting    Pt brought to the ED by wife for evaluation of vomiting and diarrhea, sudden onset yesterday evening with dry heaves. Pt denies any blood in emesis or stool, reports severe aching pain in abdomen, chest, and back worse with movement and deep breath. No fever. Family members have been sick also but did not require medical attention.  Past Medical History  Diagnosis Date  . Depression    Past Surgical History  Procedure Laterality Date  . Tonsillectomy    . Wisdom tooth extraction     No family history on file. History  Substance Use Topics  . Smoking status: Former Smoker    Types: Cigarettes  . Smokeless tobacco: Current User    Types: Chew  . Alcohol Use: Yes     Comment: occasional    Review of Systems  Gastrointestinal: Positive for vomiting and diarrhea.   All other systems reviewed and are negative except as noted in HPI.     Allergies  Review of patient's allergies indicates no known allergies.  Home Medications   Current Outpatient Rx  Name  Route  Sig  Dispense  Refill  . amoxicillin-clavulanate (AUGMENTIN) 875-125 MG per tablet   Oral   Take 1 tablet by mouth 2 (two) times daily.   12 tablet   0     Please take with food   . citalopram (CELEXA) 20 MG tablet   Oral   Take 20 mg by mouth at bedtime.          Marland Kitchen. ibuprofen (ADVIL,MOTRIN) 800 MG tablet   Oral   Take 1 tablet (800 mg total) by mouth 3 (three) times daily.   21 tablet   0     Please take with food   . Multiple Vitamin (MULTIVITAMIN WITH MINERALS) TABS  Oral   Take 1 tablet by mouth daily.         Marland Kitchen. oseltamivir (TAMIFLU) 75 MG capsule   Oral   Take 1 capsule (75 mg total) by mouth every 12 (twelve) hours.   10 capsule   0    BP 139/88  Pulse 85  Temp(Src) 98.1 F (36.7 C) (Oral)  Resp 18  SpO2 95% Physical Exam  Nursing note and vitals reviewed. Constitutional: He is oriented to person, place, and time. He appears well-developed and well-nourished.  HENT:  Head: Normocephalic and atraumatic.  Eyes: EOM are normal. Pupils are equal, round, and reactive to light.  Neck: Normal range of motion. Neck supple.  Cardiovascular: Normal rate, normal heart sounds and intact distal pulses.   Pulmonary/Chest: Effort normal and breath sounds normal.  Abdominal: Bowel sounds are normal. He exhibits no distension. There is tenderness (difuse mild). There is no rebound and no guarding.  Musculoskeletal: Normal range of motion. He exhibits no edema and no tenderness.  Neurological: He is alert and oriented to person, place, and time. He has normal strength. No cranial nerve deficit or sensory deficit.  Skin: Skin is warm and dry. No rash noted.  Psychiatric: He has a normal mood and affect.    ED Course  Procedures (including critical care time) Labs Review Labs Reviewed  CBC WITH DIFFERENTIAL - Abnormal; Notable for the following:    WBC 19.9 (*)    Hemoglobin 17.1 (*)    Neutrophils Relative % 92 (*)    Neutro Abs 18.3 (*)    Lymphocytes Relative 3 (*)    Lymphs Abs 0.6 (*)    All other components within normal limits  COMPREHENSIVE METABOLIC PANEL - Abnormal; Notable for the following:    Glucose, Bld 144 (*)    Creatinine, Ser 1.50 (*)    Total Protein 9.7 (*)    Albumin 5.4 (*)    GFR calc non Af Amer 61 (*)    GFR calc Af Amer 71 (*)    All other components within normal limits  URINALYSIS, ROUTINE W REFLEX MICROSCOPIC - Abnormal; Notable for the following:    Color, Urine AMBER (*)    Specific Gravity, Urine >1.030 (*)     Bilirubin Urine SMALL (*)    All other components within normal limits  LIPASE, BLOOD   Imaging Review Dg Abd Acute W/chest  04/27/2013   CLINICAL DATA:  Vomiting. Abdominal and chest pain. Evaluate for free air. Upper back pain after episodes of vomiting.  EXAM: ACUTE ABDOMEN SERIES (ABDOMEN 2 VIEW & CHEST 1 VIEW)  COMPARISON:  09/20/2008 chest x-ray.  No comparison abdominal films.  FINDINGS: No infiltrate, congestive heart failure or pneumothorax.  Heart size within normal limits.  Nonspecific bowel gas pattern without plain film evidence of bowel obstruction or free intraperitoneal air.  Stomach is predominately secretion filled.  Mild curvature of the thoracic and lumbar spine may be related to position. No evidence of obvious fracture.  IMPRESSION: No infiltrate, congestive heart failure or pneumothorax.  Nonspecific bowel gas pattern without plain film evidence of bowel obstruction or free intraperitoneal air.  Stomach is predominately secretion filled.   Electronically Signed   By: Bridgett Larsson M.D.   On: 04/27/2013 03:04     EKG Interpretation None      MDM   Final diagnoses:  Gastroenteritis  Dehydration    Labs and imaging reviewed, leukocytosis and moderate dehydration, but patient feeling better, pain and nausea controlled. Tolerating PO fluids and read to go home.     Charles B. Bernette Mayers, MD 04/27/13 [phone]

## 2013-04-27 NOTE — Discharge Instructions (Signed)
Dehydration, Adult °Dehydration is when you lose more fluids from the body than you take in. Vital organs like the kidneys, brain, and heart cannot function without a proper amount of fluids and salt. Any loss of fluids from the body can cause dehydration.  °CAUSES  °· Vomiting. °· Diarrhea. °· Excessive sweating. °· Excessive urine output. °· Fever. °SYMPTOMS  °Mild dehydration °· Thirst. °· Dry lips. °· Slightly dry mouth. °Moderate dehydration °· Very dry mouth. °· Sunken eyes. °· Skin does not bounce back quickly when lightly pinched and released. °· Dark urine and decreased urine production. °· Decreased tear production. °· Headache. °Severe dehydration °· Very dry mouth. °· Extreme thirst. °· Rapid, weak pulse (more than 100 beats per minute at rest). °· Cold hands and feet. °· Not able to sweat in spite of heat and temperature. °· Rapid breathing. °· Blue lips. °· Confusion and lethargy. °· Difficulty being awakened. °· Minimal urine production. °· No tears. °DIAGNOSIS  °Your caregiver will diagnose dehydration based on your symptoms and your exam. Blood and urine tests will help confirm the diagnosis. The diagnostic evaluation should also identify the cause of dehydration. °TREATMENT  °Treatment of mild or moderate dehydration can often be done at home by increasing the amount of fluids that you drink. It is best to drink small amounts of fluid more often. Drinking too much at one time can make vomiting worse. Refer to the home care instructions below. °Severe dehydration needs to be treated at the hospital where you will probably be given intravenous (IV) fluids that contain water and electrolytes. °HOME CARE INSTRUCTIONS  °· Ask your caregiver about specific rehydration instructions. °· Drink enough fluids to keep your urine clear or pale yellow. °· Drink small amounts frequently if you have nausea and vomiting. °· Eat as you normally do. °· Avoid: °· Foods or drinks high in sugar. °· Carbonated  drinks. °· Juice. °· Extremely hot or cold fluids. °· Drinks with caffeine. °· Fatty, greasy foods. °· Alcohol. °· Tobacco. °· Overeating. °· Gelatin desserts. °· Wash your hands well to avoid spreading bacteria and viruses. °· Only take over-the-counter or prescription medicines for pain, discomfort, or fever as directed by your caregiver. °· Ask your caregiver if you should continue all prescribed and over-the-counter medicines. °· Keep all follow-up appointments with your caregiver. °SEEK MEDICAL CARE IF: °· You have abdominal pain and it increases or stays in one area (localizes). °· You have a rash, stiff neck, or severe headache. °· You are irritable, sleepy, or difficult to awaken. °· You are weak, dizzy, or extremely thirsty. °SEEK IMMEDIATE MEDICAL CARE IF:  °· You are unable to keep fluids down or you get worse despite treatment. °· You have frequent episodes of vomiting or diarrhea. °· You have blood or green matter (bile) in your vomit. °· You have blood in your stool or your stool looks black and tarry. °· You have not urinated in 6 to 8 hours, or you have only urinated a small amount of very dark urine. °· You have a fever. °· You faint. °MAKE SURE YOU:  °· Understand these instructions. °· Will watch your condition. °· Will get help right away if you are not doing well or get worse. °Document Released: 02/10/2005 Document Revised: 05/05/2011 Document Reviewed: 09/30/2010 °[employer]® Patient Information ©2014 [employer]. °Viral Gastroenteritis °Viral gastroenteritis is also known as stomach flu. This condition affects the stomach and intestinal tract. It can cause sudden diarrhea and vomiting. The illness typically   lasts 3 to 8 days. Most people develop an immune response that eventually gets rid of the virus. While this natural response develops, the virus can make you quite ill. °CAUSES  °Many different viruses can cause gastroenteritis, such as rotavirus or noroviruses. You can catch one of these  viruses by consuming contaminated food or water. You may also catch a virus by sharing utensils or other personal items with an infected person or by touching a contaminated surface. °SYMPTOMS  °The most common symptoms are diarrhea and vomiting. These problems can cause a severe loss of body fluids (dehydration) and a body salt (electrolyte) imbalance. Other symptoms may include: °· Fever. °· Headache. °· Fatigue. °· Abdominal pain. °DIAGNOSIS  °Your caregiver can usually diagnose viral gastroenteritis based on your symptoms and a physical exam. A stool sample may also be taken to test for the presence of viruses or other infections. °TREATMENT  °This illness typically goes away on its own. Treatments are aimed at rehydration. The most serious cases of viral gastroenteritis involve vomiting so severely that you are not able to keep fluids down. In these cases, fluids must be given through an intravenous line (IV). °HOME CARE INSTRUCTIONS  °· Drink enough fluids to keep your urine clear or pale yellow. Drink small amounts of fluids frequently and increase the amounts as tolerated. °· Ask your caregiver for specific rehydration instructions. °· Avoid: °· Foods high in sugar. °· Alcohol. °· Carbonated drinks. °· Tobacco. °· Juice. °· Caffeine drinks. °· Extremely hot or cold fluids. °· Fatty, greasy foods. °· Too much intake of anything at one time. °· Dairy products until 24 to 48 hours after diarrhea stops. °· You may consume probiotics. Probiotics are active cultures of beneficial bacteria. They may lessen the amount and number of diarrheal stools in adults. Probiotics can be found in yogurt with active cultures and in supplements. °· Wash your hands well to avoid spreading the virus. °· Only take over-the-counter or prescription medicines for pain, discomfort, or fever as directed by your caregiver. Do not give aspirin to children. Antidiarrheal medicines are not recommended. °· Ask your caregiver if you should  continue to take your regular prescribed and over-the-counter medicines. °· Keep all follow-up appointments as directed by your caregiver. °SEEK IMMEDIATE MEDICAL CARE IF:  °· You are unable to keep fluids down. °· You do not urinate at least once every 6 to 8 hours. °· You develop shortness of breath. °· You notice blood in your stool or vomit. This may look like coffee grounds. °· You have abdominal pain that increases or is concentrated in one small area (localized). °· You have persistent vomiting or diarrhea. °· You have a fever. °· The patient is a child younger than 3 months, and he or she has a fever. °· The patient is a child older than 3 months, and he or she has a fever and persistent symptoms. °· The patient is a child older than 3 months, and he or she has a fever and symptoms suddenly get worse. °· The patient is a baby, and he or she has no tears when crying. °MAKE SURE YOU:  °· Understand these instructions. °· Will watch your condition. °· Will get help right away if you are not doing well or get worse. °Document Released: 02/10/2005 Document Revised: 05/05/2011 Document Reviewed: 11/27/2010 °[employer]® Patient Information ©2014 [employer]. ° °

## 2013-04-27 NOTE — ED Notes (Signed)
Vomiting and diarrhea onset yesterday,  States has been vomiting so hard that now his chest and back are hurting and he is having trouble moving and breathing

## 2014-05-07 ENCOUNTER — Encounter (HOSPITAL_COMMUNITY): Payer: Self-pay | Admitting: Emergency Medicine

## 2014-05-07 ENCOUNTER — Emergency Department (HOSPITAL_COMMUNITY)
Admission: EM | Admit: 2014-05-07 | Discharge: 2014-05-07 | Disposition: A | Discharge status: Home / Self Care | Payer: Managed Care, Other (non HMO) | Attending: Emergency Medicine | Admitting: Emergency Medicine

## 2014-05-07 DIAGNOSIS — M5431 Sciatica, right side: Secondary | ICD-10-CM | POA: Diagnosis not present

## 2014-05-07 DIAGNOSIS — M545 Low back pain: Secondary | ICD-10-CM | POA: Diagnosis present

## 2014-05-07 DIAGNOSIS — Z87891 Personal history of nicotine dependence: Secondary | ICD-10-CM | POA: Insufficient documentation

## 2014-05-07 DIAGNOSIS — Z79899 Other long term (current) drug therapy: Secondary | ICD-10-CM | POA: Insufficient documentation

## 2014-05-07 DIAGNOSIS — F329 Major depressive disorder, single episode, unspecified: Secondary | ICD-10-CM | POA: Insufficient documentation

## 2014-05-07 MED ORDER — HYDROCODONE-ACETAMINOPHEN 7.5-325 MG PO TABS: 1.0000 | ORAL_TABLET | Freq: Four times a day (QID) | ORAL | Status: AC | PRN

## 2014-05-07 MED ORDER — METHOCARBAMOL 500 MG PO TABS: 500.0000 mg | ORAL_TABLET | Freq: Three times a day (TID) | ORAL | Status: AC

## 2014-05-07 MED ORDER — DICLOFENAC SODIUM 75 MG PO TBEC: 75.0000 mg | DELAYED_RELEASE_TABLET | Freq: Two times a day (BID) | ORAL | Status: AC

## 2014-05-07 NOTE — Discharge Instructions (Signed)
Sciatica °Sciatica is pain, weakness, numbness, or tingling along your sciatic nerve. The nerve starts in the lower back and runs down the back of each leg. Nerve damage or certain conditions pinch or put pressure on the sciatic nerve. This causes the pain, weakness, and other discomforts of sciatica. °HOME CARE  °· Only take medicine as told by your doctor. °· Apply ice to the affected area for 20 minutes. Do this 3-4 times a day for the first 48-72 hours. Then try heat in the same way. °· Exercise, stretch, or do your usual activities if these do not make your pain worse. °· Go to physical therapy as told by your doctor. °· Keep all doctor visits as told. °· Do not wear high heels or shoes that are not supportive. °· Get a firm mattress if your mattress is too soft to lessen pain and discomfort. °GET HELP RIGHT AWAY IF:  °· You cannot control when you poop (bowel movement) or pee (urinate). °· You have more weakness in your lower back, lower belly (pelvis), butt (buttocks), or legs. °· You have redness or puffiness (swelling) of your back. °· You have a burning feeling when you pee. °· You have pain that gets worse when you lie down. °· You have pain that wakes you from your sleep. °· Your pain is worse than past pain. °· Your pain lasts longer than 4 weeks. °· You are suddenly losing weight without reason. °MAKE SURE YOU:  °· Understand these instructions. °· Will watch this condition. °· Will get help right away if you are not doing well or get worse. °Document Released: 11/20/2007 Document Revised: 08/12/2011 Document Reviewed: 06/22/2011 °[employer]® Patient Information ©2015 [employer]. This information is not intended to replace advice given to you by your health care provider. Make sure you discuss any questions you have with your health care provider. ° °
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
## 2014-06-19 ENCOUNTER — Telehealth (HOSPITAL_COMMUNITY): Payer: Self-pay

## 2014-06-19 NOTE — Telephone Encounter (Signed)
4/25 spoke to wife and she said that he was going to hold off on therapy for now.  His boss isn't working with him about letting him get off to come to an appointment.

## 2014-07-13 IMAGING — CR DG ABDOMEN ACUTE W/ 1V CHEST
3 series · 3 of 3 positions shown · non-contrast
Comparison: 09/20/2008 chest x-ray.  No comparison abdominal films.

CLINICAL DATA: Vomiting. Abdominal and chest pain. Evaluate for
free air. Upper back pain after episodes of vomiting.

EXAM:
ACUTE ABDOMEN SERIES (ABDOMEN 2 VIEW & CHEST 1 VIEW)

[view not recorded (1 of 3)]
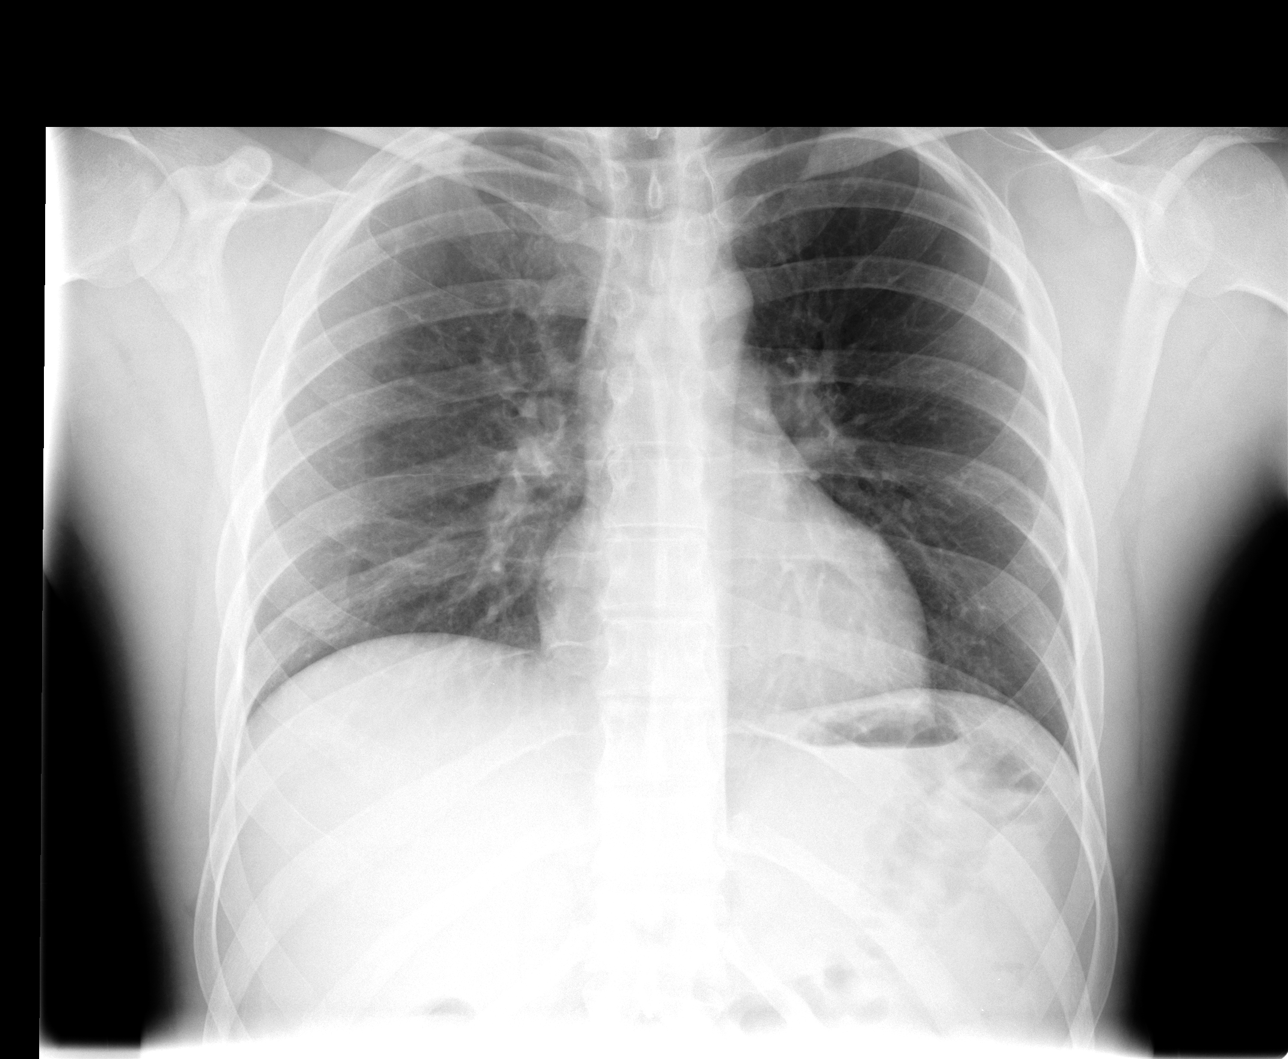

[view not recorded (2 of 3)]
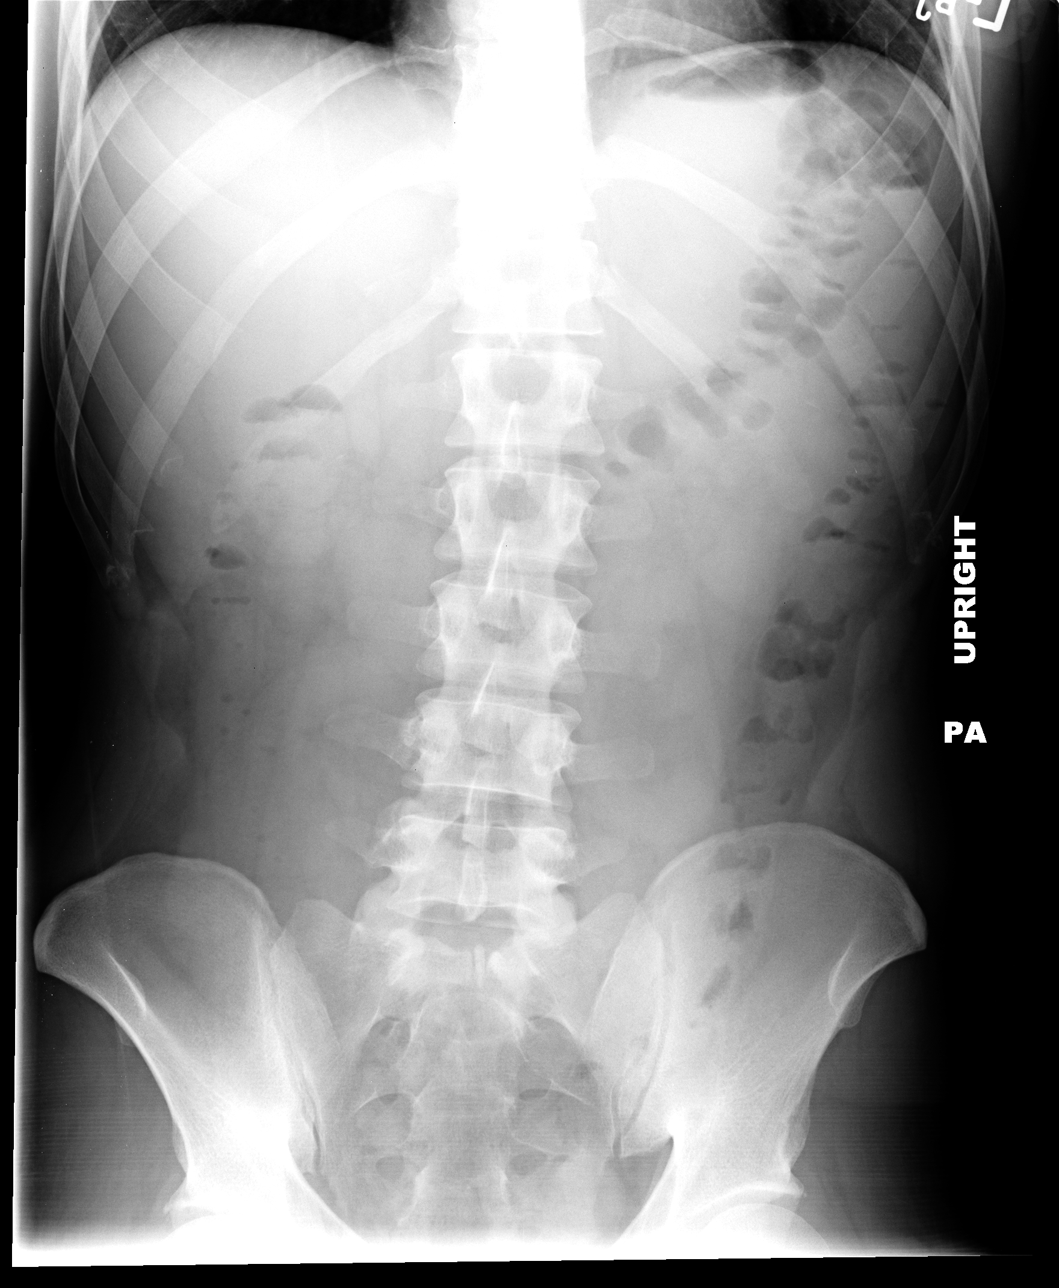

[view not recorded (3 of 3)]
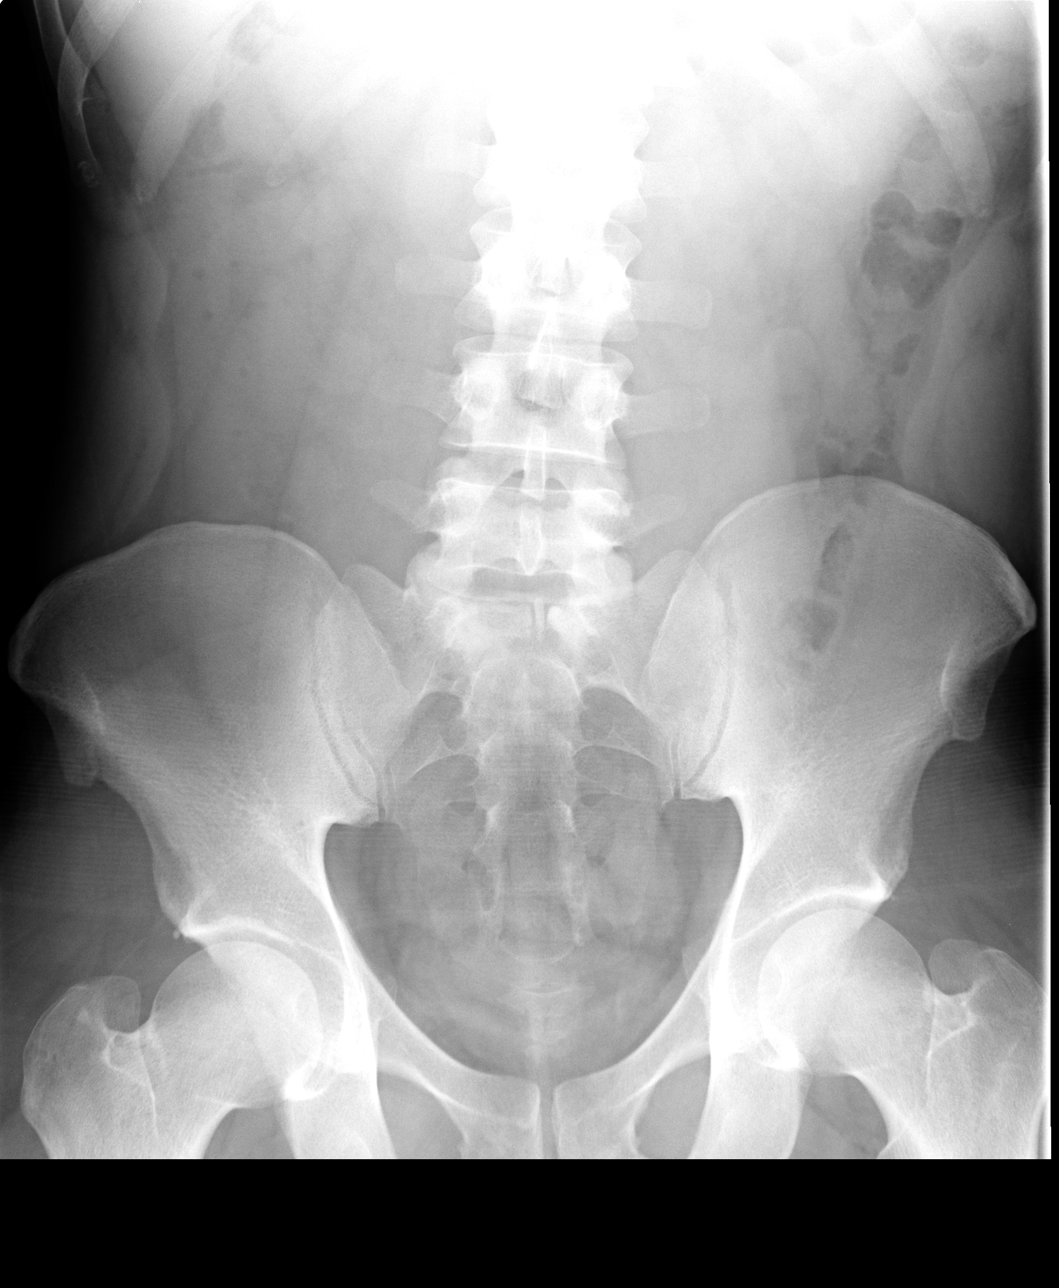

[3 of 3 positions shown; findings below may reference images not displayed]

FINDINGS: No infiltrate, congestive heart failure or pneumothorax.

Heart size within normal limits.

Nonspecific bowel gas pattern without plain film evidence of bowel
obstruction or free intraperitoneal air.

Stomach is predominately secretion filled.

Mild curvature of the thoracic and lumbar spine may be related to
position. No evidence of obvious fracture.
IMPRESSION: No infiltrate, congestive heart failure or pneumothorax.

Nonspecific bowel gas pattern without plain film evidence of bowel
obstruction or free intraperitoneal air.

Stomach is predominately secretion filled.

## 2015-09-07 ENCOUNTER — Emergency Department (HOSPITAL_COMMUNITY)
Admission: EM | Admit: 2015-09-07 | Discharge: 2015-09-07 | Disposition: A | Discharge status: Home / Self Care | Payer: PRIVATE HEALTH INSURANCE | Attending: Emergency Medicine | Admitting: Emergency Medicine

## 2015-09-07 ENCOUNTER — Encounter (HOSPITAL_COMMUNITY): Payer: Self-pay | Admitting: Emergency Medicine

## 2015-09-07 ENCOUNTER — Emergency Department (HOSPITAL_COMMUNITY): Payer: PRIVATE HEALTH INSURANCE

## 2015-09-07 DIAGNOSIS — Y999 Unspecified external cause status: Secondary | ICD-10-CM | POA: Insufficient documentation

## 2015-09-07 DIAGNOSIS — S60221A Contusion of right hand, initial encounter: Secondary | ICD-10-CM | POA: Insufficient documentation

## 2015-09-07 DIAGNOSIS — F17200 Nicotine dependence, unspecified, uncomplicated: Secondary | ICD-10-CM | POA: Insufficient documentation

## 2015-09-07 DIAGNOSIS — F329 Major depressive disorder, single episode, unspecified: Secondary | ICD-10-CM | POA: Insufficient documentation

## 2015-09-07 DIAGNOSIS — Y929 Unspecified place or not applicable: Secondary | ICD-10-CM | POA: Insufficient documentation

## 2015-09-07 DIAGNOSIS — W01198A Fall on same level from slipping, tripping and stumbling with subsequent striking against other object, initial encounter: Secondary | ICD-10-CM | POA: Insufficient documentation

## 2015-09-07 DIAGNOSIS — Z79899 Other long term (current) drug therapy: Secondary | ICD-10-CM | POA: Insufficient documentation

## 2015-09-07 DIAGNOSIS — Y939 Activity, unspecified: Secondary | ICD-10-CM | POA: Insufficient documentation

## 2015-09-07 MED ORDER — NAPROXEN 500 MG PO TABS: 500.0000 mg | ORAL_TABLET | Freq: Two times a day (BID) | ORAL | Status: AC

## 2015-09-07 NOTE — ED Notes (Signed)
Patient states he fell landing on his right hand 2 days ago. Complaining of pain to right 4th finger on the knuckle.
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
## 2015-09-07 NOTE — Discharge Instructions (Signed)

## 2016-11-22 IMAGING — DX DG HAND COMPLETE 3+V*R*
3 series · 3 of 3 positions shown · non-contrast
Comparison: Radiographs January 24, 2013.

CLINICAL DATA: Acute right hand pain after fall.

EXAM:
RIGHT HAND - COMPLETE 3+ VIEW

[hand pa]
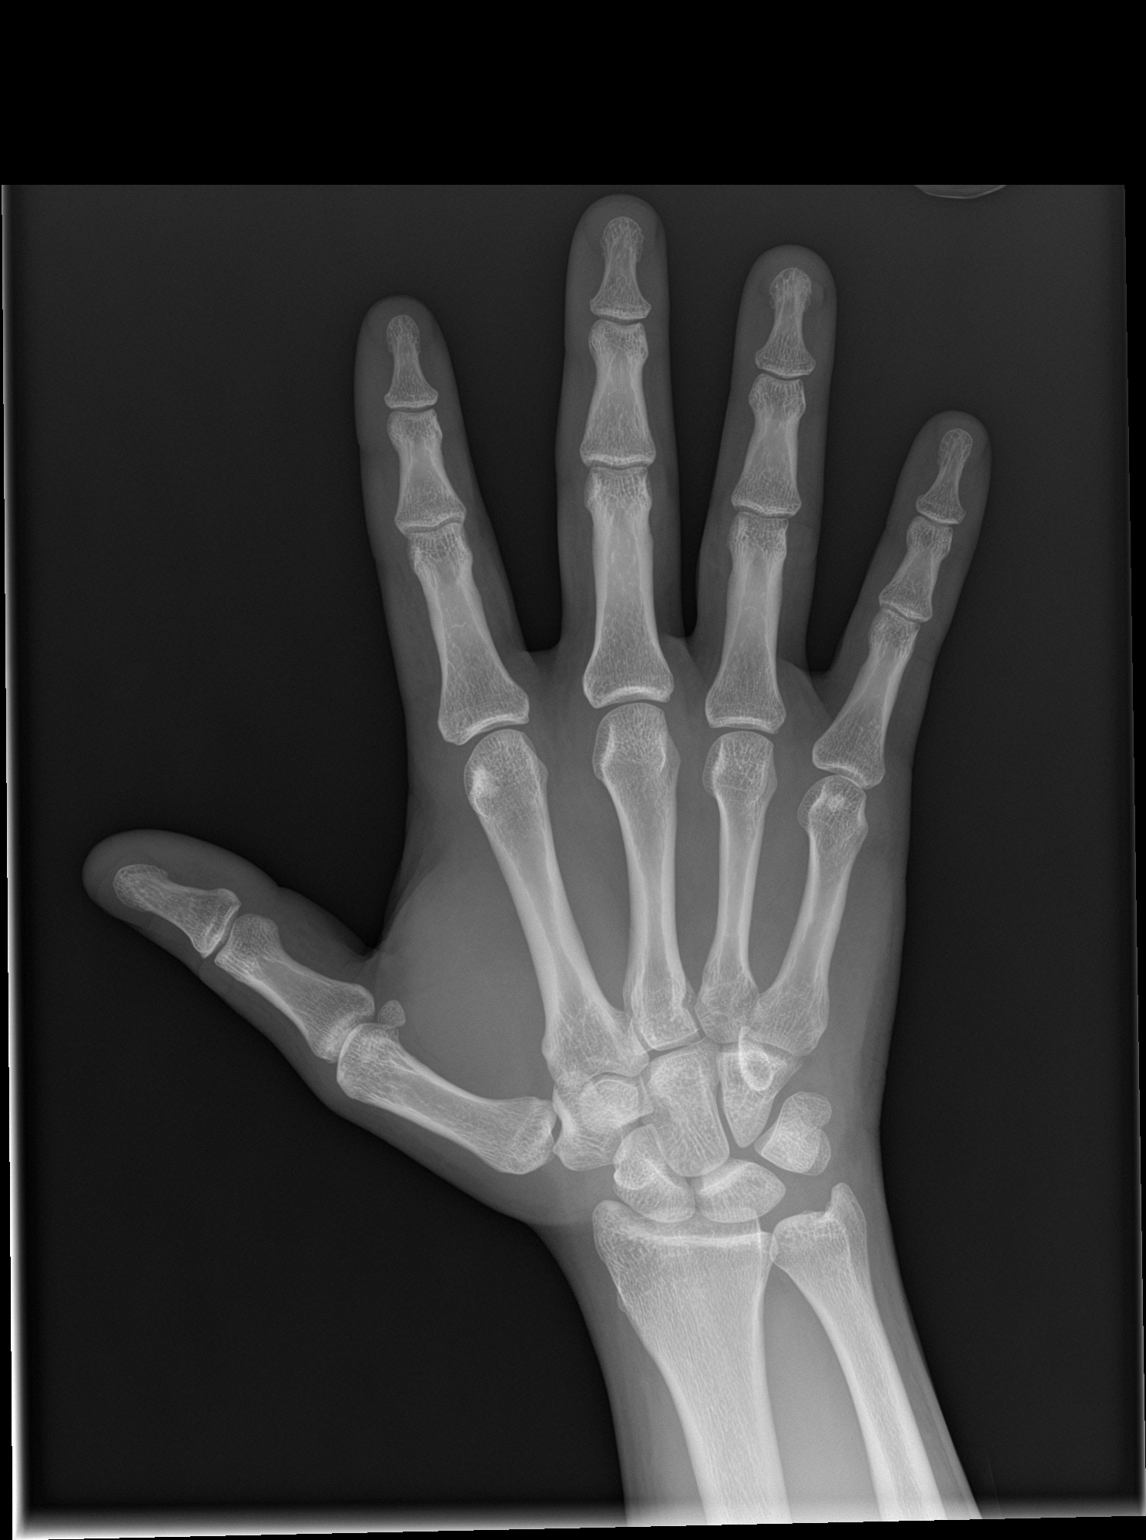

[hand obl]
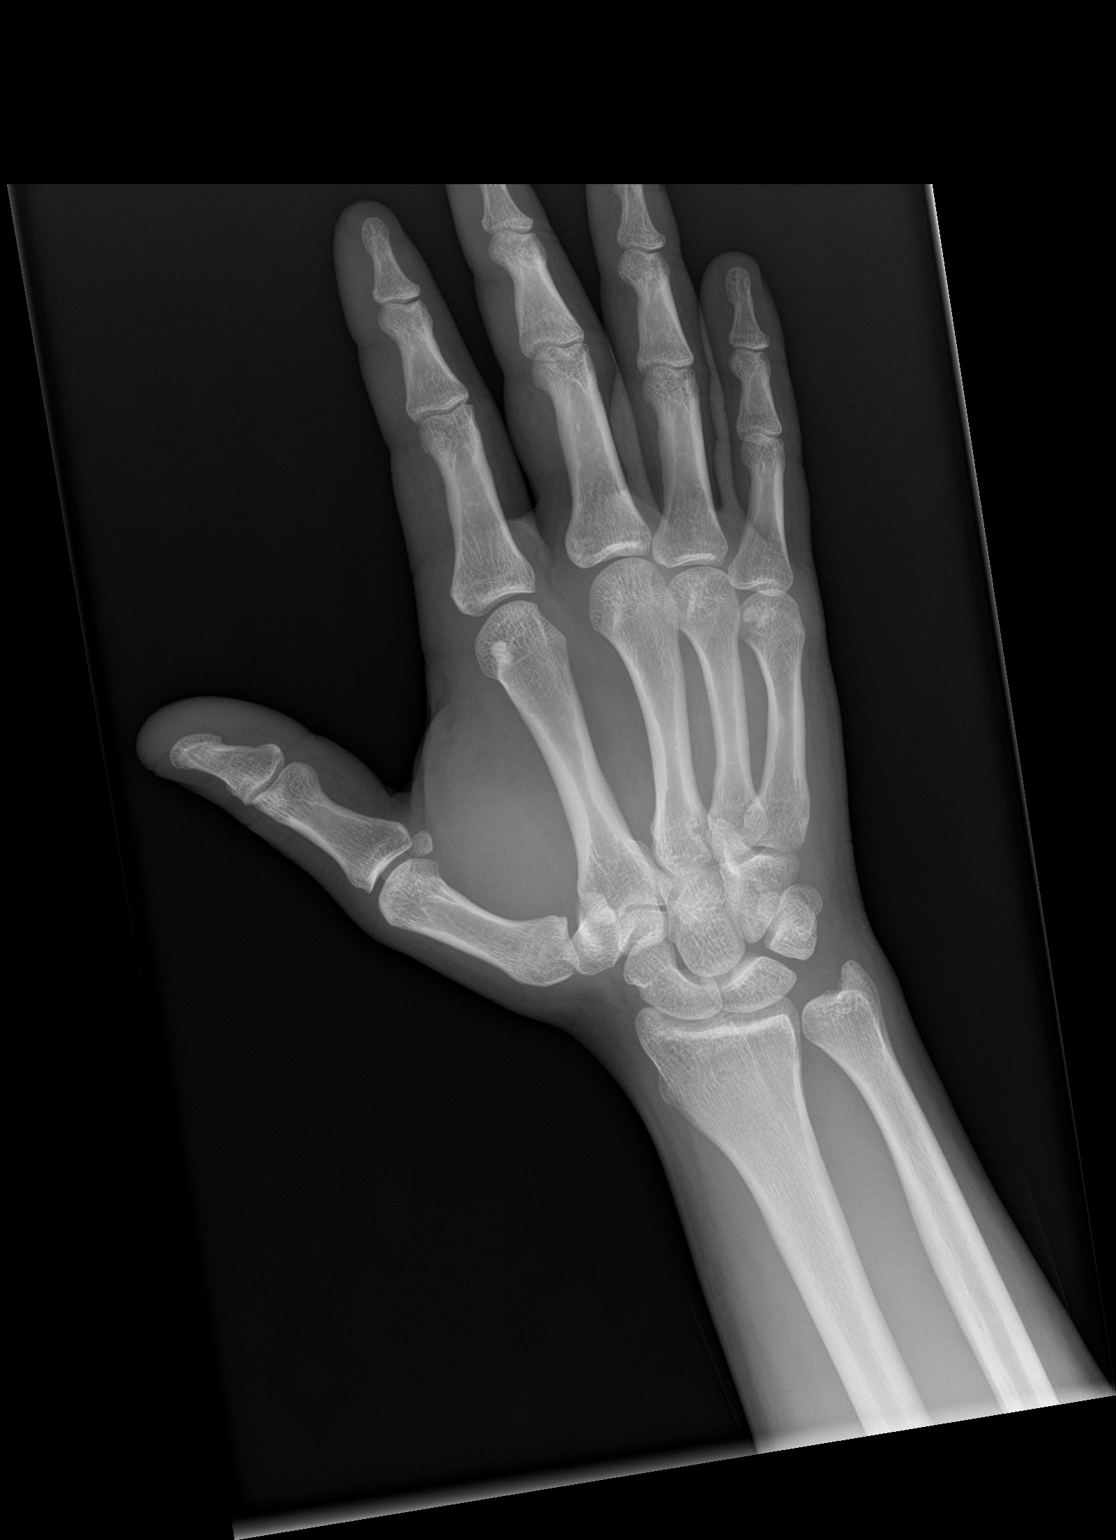

[hand lat]
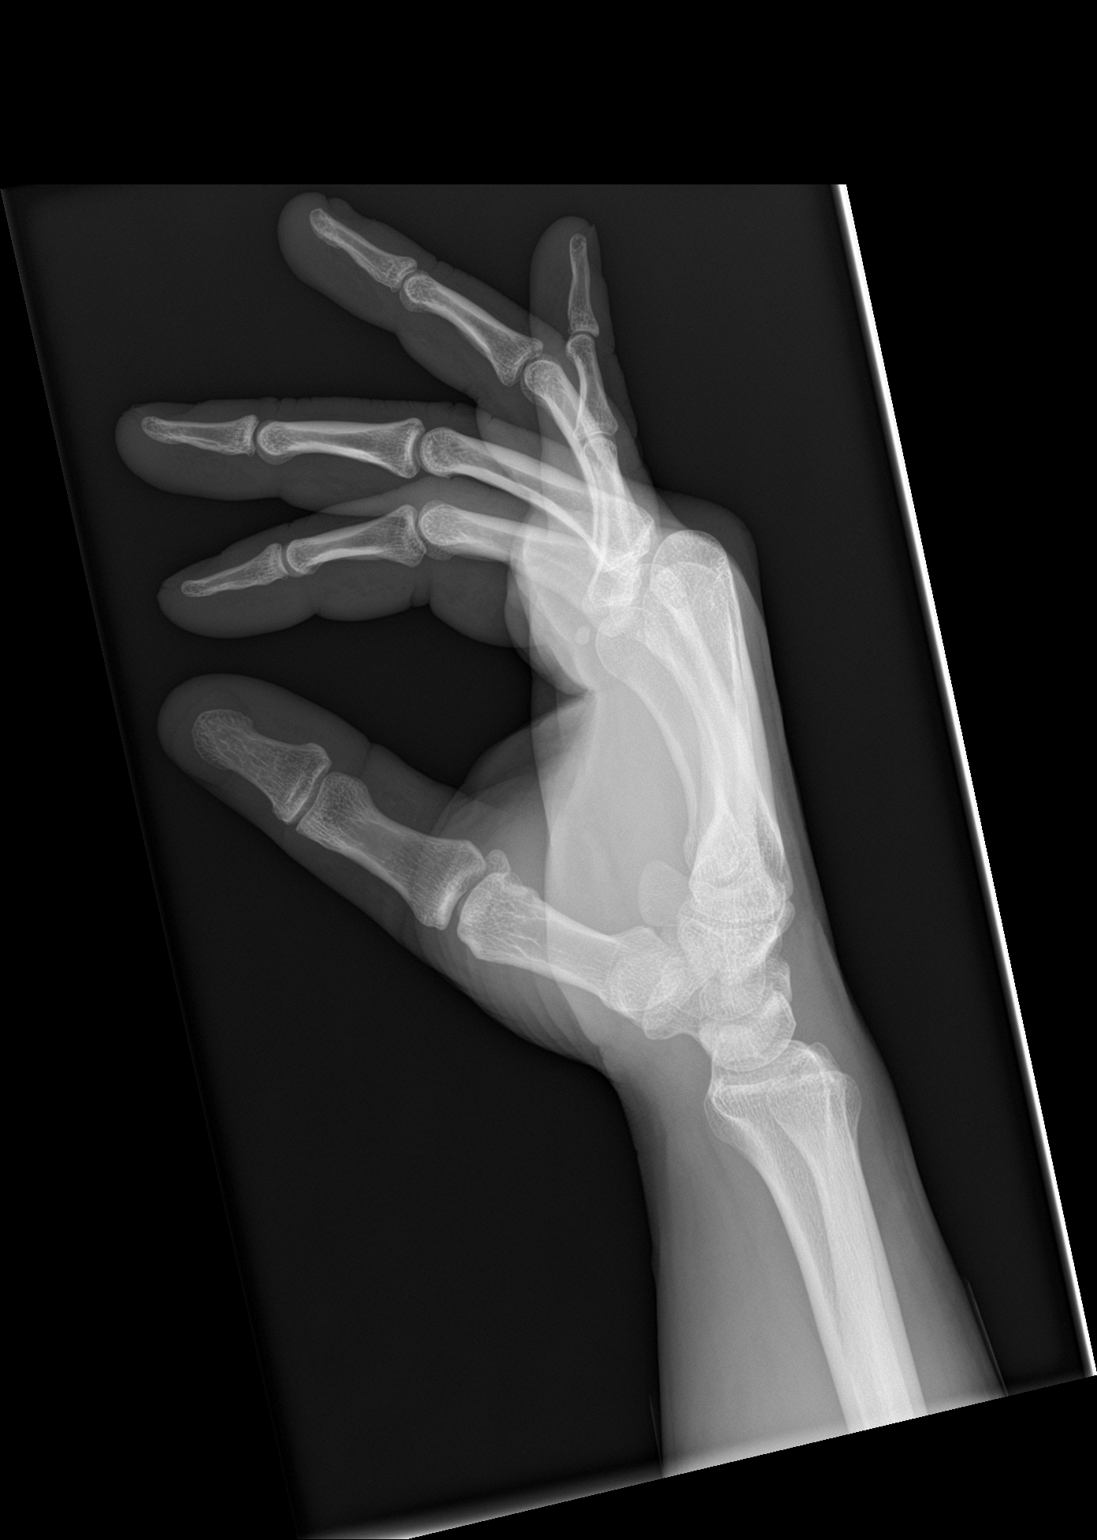

[3 of 3 positions shown; findings below may reference images not displayed]

FINDINGS: There is no evidence of fracture or dislocation. There is no
evidence of arthropathy or other focal bone abnormality. Soft
tissues are unremarkable.
IMPRESSION: Normal right hand.
